# Patient Record
Sex: Male | Born: 2000 | Race: Black or African American | Hispanic: No | Marital: Single | Smoking: Never smoker
Health system: Southern US, Community
[De-identification: ages and names within clinical notes are randomized; demographics above are authoritative.]

## PROBLEM LIST (undated history)

## (undated) DIAGNOSIS — D72819 Decreased white blood cell count, unspecified: Secondary | ICD-10-CM

## (undated) HISTORY — DX: Decreased white blood cell count, unspecified: D72.819

---
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
## 2018-12-26 ENCOUNTER — Encounter (HOSPITAL_COMMUNITY): Payer: Self-pay

## 2018-12-26 ENCOUNTER — Ambulatory Visit (HOSPITAL_COMMUNITY)
Admission: EM | Admit: 2018-12-26 | Discharge: 2018-12-26 | Disposition: A | Discharge status: Home / Self Care | Payer: PRIVATE HEALTH INSURANCE | Attending: Urgent Care | Admitting: Urgent Care

## 2018-12-26 ENCOUNTER — Other Ambulatory Visit: Payer: Self-pay

## 2018-12-26 DIAGNOSIS — R1032 Left lower quadrant pain: Secondary | ICD-10-CM

## 2018-12-26 DIAGNOSIS — Y93B9 Activity, other involving muscle strengthening exercises: Secondary | ICD-10-CM

## 2018-12-26 DIAGNOSIS — S76212A Strain of adductor muscle, fascia and tendon of left thigh, initial encounter: Secondary | ICD-10-CM | POA: Diagnosis not present

## 2018-12-26 MED ORDER — NAPROXEN 500 MG PO TABS: 500.0000 mg | ORAL_TABLET | Freq: Two times a day (BID) | ORAL | 0 refills | Status: AC

## 2018-12-26 MED ORDER — CYCLOBENZAPRINE HCL 5 MG PO TABS: 5.0000 mg | ORAL_TABLET | Freq: Three times a day (TID) | ORAL | 0 refills | Status: AC | PRN

## 2018-12-26 NOTE — ED Provider Notes (Signed)
  MRN: [id number] DOB: 08-19-2000  Subjective:   Tyrone Scott is a 18 y.o. male presenting for 4 to 5-day history of intermittent persistent aching mid to left lower abdominal pain. Patient was doing squats and over-head press. The next morning, patient awoke with tenderness of lower abdomen, mid-left side closer to his groin area. Tried APAP with some relief. Patient was tested for COVID 19 last week due to indirect exposure and was negative.   No current facility-administered medications for this encounter.   Current Outpatient Medications:  .  clindamycin-benzoyl peroxide (BENZACLIN) gel, Apply topically 2 (two) times daily., Disp: 25 g, Rfl: 6 .  ibuprofen (ADVIL,MOTRIN) 600 MG tablet, Take 1 tablet (600 mg total) by mouth every 8 (eight) hours as needed., Disp: 30 tablet, Rfl: 0   No Known Allergies  Past Medical History:  Diagnosis Date  . 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   ROS Denies fever, nausea, vomiting, dysuria, hematuria, protruding mass.  Objective:   Vitals: BP 126/66 (BP Location: Right Arm)   Pulse 74   Temp 99.1 F (37.3 C) (Oral)   Resp 16   SpO2 100%   Physical Exam Constitutional:      Appearance: Normal appearance. He is well-developed and normal weight.  HENT:     Head: Normocephalic and atraumatic.     Right Ear: External ear normal.     Left Ear: External ear normal.     Nose: Nose normal.     Mouth/Throat:     Pharynx: Oropharynx is clear.  Eyes:     Extraocular Movements: Extraocular movements intact.     Pupils: Pupils are equal, round, and reactive to light.  Cardiovascular:     Rate and Rhythm: Normal rate.  Pulmonary:     Effort: Pulmonary effort is normal.  Abdominal:    Neurological:     Mental Status: He is alert and oriented to person, place, and time.  Psychiatric:        Mood and Affect: Mood normal.        Behavior: Behavior normal.      Assessment and Plan :    1. Strain of groin, left, initial encounter   2. Groin pain, left     Will manage conservatively for groin strain with NSAID and muscle relaxant, rest and modification of physical activity.  Anticipatory guidance provided.  Discussed signs and symptoms of hernia.  Counseled patient on potential for adverse effects with medications prescribed/recommended today, ER and return-to-clinic precautions discussed, patient verbalized understanding.   Jaynee Eagles, [place] 12/26/18 1115

## 2018-12-26 NOTE — ED Triage Notes (Signed)
Pt present left lower abdominal pain, pt states that he went to the gym on Tuesday and woke up to sore/ tenderness in his stomach. Pt denies any other symptoms

## 2021-09-05 ENCOUNTER — Other Ambulatory Visit: Payer: Self-pay

## 2021-09-05 ENCOUNTER — Encounter (HOSPITAL_COMMUNITY): Payer: Self-pay

## 2021-09-05 ENCOUNTER — Ambulatory Visit (HOSPITAL_COMMUNITY)
Admission: RE | Admit: 2021-09-05 | Discharge: 2021-09-05 | Disposition: A | Discharge status: Home / Self Care | Payer: PRIVATE HEALTH INSURANCE | Source: Ambulatory Visit

## 2021-09-05 VITALS — BP 123/83 | HR 75 | Temp 98.1°F | Resp 18

## 2021-09-05 DIAGNOSIS — H60391 Other infective otitis externa, right ear: Secondary | ICD-10-CM

## 2021-09-05 MED ORDER — OFLOXACIN 0.3 % OT SOLN: 10.0000 [drp] | Freq: Two times a day (BID) | OTIC | 0 refills | Status: AC

## 2021-09-05 NOTE — ED Notes (Signed)
Patient is not done at registration

## 2021-09-05 NOTE — ED Provider Notes (Signed)
[facility]    CSN: [id number] Arrival date & time: 09/05/21  0840      History   Chief Complaint Chief Complaint  Patient presents with   Ear Fullness    HPI Normal Recinos is a 21 y.o. male. He reports Right ear fullness x 2 days.  Denies pain.  Has used debrox.  Reports this was slightly painful.  Denies runny nose cough or fever or feeling ill. Has not been swimming lately.    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 No pertinent surgical history.     Home Medications    Prior to Admission medications   Medication Sig Start Date End Date Taking? Authorizing Provider  acetaminophen (TYLENOL) 325 MG tablet Take 650 mg by mouth every 6 (six) hours as needed.   Yes [provider]  ofloxacin (FLOXIN) 0.3 % OTIC solution Place 10 drops into the right ear 2 (two) times daily. 09/05/21  Yes Cathlyn Parsons, NP  clindamycin-benzoyl peroxide (BENZACLIN) gel Apply topically 2 (two) times daily. Patient not taking: Reported on 09/05/2021 12/30/14   Raliegh Ip, DO  cyclobenzaprine (FLEXERIL) 5 MG tablet Take 1 tablet (5 mg total) by mouth 3 (three) times daily as needed for muscle spasms. Patient not taking: Reported on 09/05/2021 12/26/18   Wallis Bamberg, PA-C  ibuprofen (ADVIL,MOTRIN) 600 MG tablet Take 1 tablet (600 mg total) by mouth every 8 (eight) hours as needed. Patient not taking: Reported on 09/05/2021 02/22/13   Tyrone Nine, MD  naproxen (NAPROSYN) 500 MG tablet Take 1 tablet (500 mg total) by mouth 2 (two) times daily. Patient not taking: Reported on 09/05/2021 12/26/18   Wallis Bamberg, PA-C    Family History Family History  Problem Relation Age of Onset   Healthy Mother     Social History Social History   Tobacco Use    Smoking status: Never  Vaping Use   Vaping Use: Never used  Substance Use Topics   Alcohol use: Yes   Drug use: Never     Allergies   Patient has no known allergies.   Review of Systems Review of Systems   Physical Exam Triage Vital Signs ED Triage Vitals  Enc Vitals Group     BP 09/05/21 0900 123/83     Pulse Rate 09/05/21 0900 75     Resp 09/05/21 0900 18     Temp 09/05/21 0900 98.1 F (36.7 C)     Temp Source 09/05/21 0900 Oral     SpO2 09/05/21 0900 97 %     Weight --      Height --      Head Circumference --      Peak Flow --      Pain Score 09/05/21 0857 0     Pain Loc --      Pain Edu? --      Excl. in GC? --    No data found.  Updated Vital Signs BP 123/83 (BP Location: Right Arm)   Pulse 75   Temp 98.1 F (36.7 C) (Oral)   Resp 18   SpO2 97%   Visual Acuity Right Eye Distance:   Left Eye Distance:   Bilateral Distance:  Right Eye Near:   Left Eye Near:    Bilateral Near:     Physical Exam Constitutional:      Appearance: Normal appearance. He is not ill-appearing.  HENT:     Right Ear: Tympanic membrane and external ear normal.     Left Ear: Tympanic membrane, ear canal and external ear normal.     Ears:     Comments: R ear canal with edema, white & thick discharge.     Mouth/Throat:     Mouth: Mucous membranes are moist.     Pharynx: Oropharynx is clear.  Cardiovascular:     Rate and Rhythm: Normal rate and regular rhythm.  Pulmonary:     Effort: Pulmonary effort is normal.     Breath sounds: Normal breath sounds.  Neurological:     Mental Status: He is alert.      UC Treatments / Results  Labs (all labs ordered are listed, but only abnormal results are displayed) Labs Reviewed - No data to display  EKG   Radiology No results found.  Procedures Procedures (including critical care time)  Medications Ordered in UC Medications - No data to display  Initial Impression / Assessment and Plan / UC Course  I have  reviewed the triage vital signs and the nursing notes.  Pertinent labs & imaging results that were available during my care of the patient were reviewed by me and considered in my medical decision making (see chart for details).    Pt with R otitis externa. Reports he used to get swimmer's ear all the time as a child but hasn't had it in years. We discussed prevention and treatment.   Final Clinical Impressions(s) / UC Diagnoses   Final diagnoses:  Infective otitis externa of right ear   Discharge Instructions   None    ED Prescriptions     Medication Sig Dispense Auth. Provider   ofloxacin (FLOXIN) 0.3 % OTIC solution Place 10 drops into the right ear 2 (two) times daily. 5 mL Cathlyn Parsons, NP      PDMP not reviewed this encounter.   Cathlyn Parsons, NP 09/05/21 [phone]

## 2021-09-05 NOTE — ED Triage Notes (Signed)
Right ear fullness x 2 days.  Denies pain.  Has used debrox.  Reports this was slightly painful.  Denies runny nose cough or fever.

## 2021-10-01 ENCOUNTER — Ambulatory Visit (HOSPITAL_COMMUNITY): Payer: Self-pay

## 2021-10-11 ENCOUNTER — Ambulatory Visit (HOSPITAL_COMMUNITY): Payer: Self-pay
